# Patient Record
Sex: Female | Born: 2000
Health system: Southern US, Community
[De-identification: ages and names within clinical notes are randomized; demographics above are authoritative.]

## PROBLEM LIST (undated history)

## (undated) DIAGNOSIS — F909 Attention-deficit hyperactivity disorder, unspecified type: Secondary | ICD-10-CM

---
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
## 2021-03-04 ENCOUNTER — Ambulatory Visit: Payer: 59 | Admitting: Podiatry

## 2021-10-01 ENCOUNTER — Ambulatory Visit: Payer: 59 | Admitting: Podiatry

## 2022-01-18 ENCOUNTER — Ambulatory Visit (HOSPITAL_COMMUNITY)
Admission: EM | Admit: 2022-01-18 | Discharge: 2022-01-18 | Disposition: A | Discharge status: Home / Self Care | Payer: 59 | Attending: Internal Medicine | Admitting: Internal Medicine

## 2022-01-18 ENCOUNTER — Ambulatory Visit (INDEPENDENT_AMBULATORY_CARE_PROVIDER_SITE_OTHER): Payer: 59

## 2022-01-18 ENCOUNTER — Encounter (HOSPITAL_COMMUNITY): Payer: Self-pay | Admitting: Emergency Medicine

## 2022-01-18 DIAGNOSIS — S60221A Contusion of right hand, initial encounter: Secondary | ICD-10-CM

## 2022-01-18 HISTORY — DX: Attention-deficit hyperactivity disorder, unspecified type: F90.9
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
## 2022-01-18 NOTE — Discharge Instructions (Addendum)
Take ibuprofen as needed for pain Gentle range of motion exercises involving the right hand Your x-ray is negative for fracture Return to urgent care if symptoms worsen.
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
## 2022-02-12 ENCOUNTER — Ambulatory Visit: Payer: 59 | Admitting: Internal Medicine

## 2022-05-09 ENCOUNTER — Other Ambulatory Visit: Payer: Self-pay

## 2022-05-09 ENCOUNTER — Encounter (HOSPITAL_COMMUNITY): Payer: Self-pay | Admitting: Emergency Medicine

## 2022-05-09 ENCOUNTER — Emergency Department (HOSPITAL_COMMUNITY)
Admission: EM | Admit: 2022-05-09 | Discharge: 2022-05-09 | Disposition: A | Discharge status: Home / Self Care | Payer: 59 | Attending: Emergency Medicine | Admitting: Emergency Medicine

## 2022-05-09 ENCOUNTER — Emergency Department (HOSPITAL_COMMUNITY): Payer: 59

## 2022-05-09 DIAGNOSIS — R1084 Generalized abdominal pain: Secondary | ICD-10-CM | POA: Diagnosis not present

## 2022-05-09 DIAGNOSIS — D72829 Elevated white blood cell count, unspecified: Secondary | ICD-10-CM | POA: Insufficient documentation

## 2022-05-09 DIAGNOSIS — R1032 Left lower quadrant pain: Secondary | ICD-10-CM | POA: Diagnosis present

## 2022-05-09 DIAGNOSIS — R112 Nausea with vomiting, unspecified: Secondary | ICD-10-CM | POA: Diagnosis not present

## 2022-05-09 LAB — COMPREHENSIVE METABOLIC PANEL
ALT: 15 U/L (ref 0–44)
AST: 22 U/L (ref 15–41)
Albumin: 3.9 g/dL (ref 3.5–5.0)
Alkaline Phosphatase: 85 U/L (ref 38–126)
Anion gap: 12 (ref 5–15)
BUN: 7 mg/dL (ref 6–20)
CO2: 25 mmol/L (ref 22–32)
Calcium: 9.2 mg/dL (ref 8.9–10.3)
Chloride: 102 mmol/L (ref 98–111)
Creatinine, Ser: 0.93 mg/dL (ref 0.44–1.00)
GFR, Estimated: >60 mL/min (ref >60)
Glucose, Bld: 109 mg/dL — ABNORMAL HIGH (ref 70–99)
Potassium: 4 mmol/L (ref 3.5–5.1)
Sodium: 139 mmol/L (ref 135–145)
Total Bilirubin: 0.7 mg/dL (ref 0.3–1.2)
Total Protein: 7.1 g/dL (ref 6.5–8.1)

## 2022-05-09 LAB — URINALYSIS, ROUTINE W REFLEX MICROSCOPIC
Bilirubin Urine: NEGATIVE
Glucose, UA: NEGATIVE mg/dL
Ketones, ur: NEGATIVE mg/dL
Leukocytes,Ua: NEGATIVE
Nitrite: NEGATIVE
Protein, ur: NEGATIVE mg/dL
Specific Gravity, Urine: 1.008 (ref 1.005–1.030)
pH: 7 (ref 5.0–8.0)

## 2022-05-09 LAB — CBC
HCT: 41 % (ref 36.0–46.0)
Hemoglobin: 14.4 g/dL (ref 12.0–15.0)
MCH: 32.6 pg (ref 26.0–34.0)
MCHC: 35.1 g/dL (ref 30.0–36.0)
MCV: 92.8 fL (ref 80.0–100.0)
Platelets: 316 10*3/uL (ref 150–400)
RBC: 4.42 MIL/uL (ref 3.87–5.11)
RDW: 11.8 % (ref 11.5–15.5)
WBC: 14.3 10*3/uL — ABNORMAL HIGH (ref 4.0–10.5)
nRBC: 0 % (ref 0.0–0.2)

## 2022-05-09 LAB — I-STAT BETA HCG BLOOD, ED (MC, WL, AP ONLY): I-stat hCG, quantitative: <5 m[IU]/mL (ref <5)

## 2022-05-09 LAB — LIPASE, BLOOD: Lipase: 28 U/L (ref 11–51)

## 2022-05-09 MED ORDER — MORPHINE SULFATE (PF) 4 MG/ML IV SOLN
4.0000 mg | Freq: Once | INTRAVENOUS | Status: AC
  Administered 2022-05-09: 4 mg via INTRAVENOUS
  Filled 2022-05-09: qty 1

## 2022-05-09 MED ORDER — ONDANSETRON HCL 4 MG/2ML IJ SOLN
4.0000 mg | Freq: Once | INTRAMUSCULAR | Status: AC
  Administered 2022-05-09: 4 mg via INTRAVENOUS
  Filled 2022-05-09: qty 2

## 2022-05-09 MED ORDER — ONDANSETRON 4 MG PO TBDP
4.0000 mg | ORAL_TABLET | Freq: Once | ORAL | Status: AC
  Administered 2022-05-09: 4 mg via ORAL
  Filled 2022-05-09: qty 1

## 2022-05-09 MED ORDER — SODIUM CHLORIDE 0.9 % IV BOLUS
1000.0000 mL | Freq: Once | INTRAVENOUS | Status: AC
  Administered 2022-05-09: 1000 mL via INTRAVENOUS

## 2022-05-09 MED ORDER — ONDANSETRON 4 MG PO TBDP: 4.0000 mg | ORAL_TABLET | Freq: Three times a day (TID) | ORAL | 0 refills | Status: AC | PRN

## 2022-05-09 NOTE — ED Notes (Signed)
Patient transported to Ultrasound 

## 2022-05-09 NOTE — Discharge Instructions (Signed)
Plan diet, advance slowly as tolerated.  Recheck with your doctor if symptoms.  Seen abdominal pain the next 24 hours.  Return to the emergency room anytime for worsening or concerning symptoms.

## 2022-05-09 NOTE — ED Provider Notes (Signed)
[facility] EMERGENCY DEPARTMENT Provider Note   CSN: [id number] Arrival date & time: 05/09/22  0740     History  Chief Complaint  Patient presents with   Abdominal Pain   Emesis   Nausea    Sara Mcdaniel is a 21 y.o. female.  21 yo female with history of ADHD brought in by mom with complaint of LLQ abdominal pain that work her from her sleep at 3am. Pain radiates up to LUQ and across to RLQ. Associated with nausea and vomiting. Denies changes in bowel or bladder habits. Given zofran in triage and feels this made pain worse.  No prior abdominal surgeries. Does not drink alcohol, no drug use, non smoker. No known sick contacts.        Home Medications Prior to Admission medications   Medication Sig Start Date End Date Taking? Authorizing Provider  ibuprofen (ADVIL) 200 MG tablet Take 400 mg by mouth every 6 (six) hours as needed for cramping.   Yes [provider]  lamoTRIgine (LAMICTAL) 150 MG tablet Take 150 mg by mouth daily. 01/17/22  Yes [provider]  ondansetron (ZOFRAN-ODT) 4 MG disintegrating tablet Take 1 tablet (4 mg total) by mouth every 8 (eight) hours as needed for nausea or vomiting. 05/09/22  Yes Jeannie Fend, PA-C  OVER THE COUNTER MEDICATION Take 1 capsule by mouth daily as needed (appetite control). SLMR weight loss supplement   Yes [provider]  sertraline (ZOLOFT) 50 MG tablet Take 50 mg by mouth daily. 01/17/22  Yes [provider]  TRI-ESTARYLLA 0.18/0.215/0.25 MG-35 MCG tablet Take 1 tablet by mouth daily. 01/15/22  Yes [provider]  VYVANSE 40 MG capsule Take 40 mg by mouth daily. 12/23/21  Yes [provider]      Allergies    Amoxil [amoxicillin] and Penicillins    Review of Systems   Review of Systems Negative except as per HPI Physical Exam Updated Vital Signs BP 130/89   Pulse (!) 56   Temp 98 F (36.7 C)   Resp 15   Ht 5\' 4"  (1.626 m)   Wt 57.2 kg   LMP  04/22/2022   SpO2 99%   BMI 21.63 kg/m  Physical Exam Vitals and nursing note reviewed.  Constitutional:      General: She is not in acute distress.    Appearance: She is well-developed. She is not diaphoretic.  HENT:     Head: Normocephalic and atraumatic.  Cardiovascular:     Rate and Rhythm: Normal rate and regular rhythm.     Heart sounds: Normal heart sounds.  Pulmonary:     Effort: Pulmonary effort is normal.     Breath sounds: Normal breath sounds.  Abdominal:     Palpations: Abdomen is soft.     Tenderness: There is generalized abdominal tenderness. There is no right CVA tenderness, left CVA tenderness, guarding or rebound.  Skin:    General: Skin is warm and dry.  Neurological:     Mental Status: She is alert and oriented to person, place, and time.  Psychiatric:        Mood and Affect: Mood is anxious.        Behavior: Behavior normal.     ED Results / Procedures / Treatments   Labs (all labs ordered are listed, but only abnormal results are displayed) Labs Reviewed  COMPREHENSIVE METABOLIC PANEL - Abnormal; Notable for the following components:      Result Value  Glucose, Bld 109 (*)    All other components within normal limits  CBC - Abnormal; Notable for the following components:   WBC 14.3 (*)    All other components within normal limits  URINALYSIS, ROUTINE W REFLEX MICROSCOPIC - Abnormal; Notable for the following components:   Color, Urine STRAW (*)    Hgb urine dipstick MODERATE (*)    Bacteria, UA RARE (*)    All other components within normal limits  LIPASE, BLOOD  I-STAT BETA HCG BLOOD, ED ([facility], [facility], [facility] ONLY)    EKG None  Radiology US PELVIC COMPLETE W TRANSVAGINAL AND TORSION R/O  Result Date: 05/09/2022 CLINICAL DATA:  Left lower quadrant pain since 3 a.m. EXAM: TRANSABDOMINAL AND TRANSVAGINAL ULTRASOUND OF PELVIS DOPPLER ULTRASOUND OF OVARIES TECHNIQUE: Both transabdominal and transvaginal ultrasound examinations of the pelvis were  performed. Transabdominal technique was performed for global imaging of the pelvis including uterus, ovaries, adnexal regions, and pelvic cul-de-sac. It was necessary to proceed with endovaginal exam following the transabdominal exam to visualize the adnexa. Color and duplex Doppler ultrasound was utilized to evaluate blood flow to the ovaries. COMPARISON:  None Available. FINDINGS: Uterus Measurements: 6 x 3 x 4 cm = volume: 45 mL. No fibroids or other mass visualized. Endometrium Thickness: 5 mm.  No focal abnormality visualized. Right ovary Measurements: 30 x 14 x 24 mm = volume: 5.4 mL. Normal appearance/no adnexal mass. Left ovary Measurements: 23 x 23 x 14 mm = volume: 3.8 mL. Normal appearance/no adnexal mass. Pulsed Doppler evaluation of both ovaries demonstrates normal low-resistance arterial and venous waveforms. Other findings No abnormal free fluid. IMPRESSION: Normal pelvic ultrasound. Electronically Signed   By: Tiburcio Pea M.D.   On: 05/09/2022 11:50    Procedures Procedures    Medications Ordered in ED Medications  ondansetron (ZOFRAN-ODT) disintegrating tablet 4 mg (4 mg Oral Given 05/09/22 0752)  sodium chloride 0.9 % bolus 1,000 mL (0 mLs Intravenous Stopped 05/09/22 1108)  ondansetron (ZOFRAN) injection 4 mg (4 mg Intravenous Given 05/09/22 0901)  morphine (PF) 4 MG/ML injection 4 mg (4 mg Intravenous Given 05/09/22 0901)    ED Course/ Medical Decision Making/ A&P                           Medical Decision Making Amount and/or Complexity of Data Reviewed Labs: ordered. Radiology: ordered.  Risk Prescription drug management.   This patient presents to the ED for concern of abdominal pain, this involves an extensive number of treatment options, and is a complaint that carries with it a high risk of complications and morbidity.  The differential diagnosis includes but not limited to gastritis, ovarian cyst, ovarian torsion, colitis, diverticulitis, SBO, appendicitis,  pancreatitis, UTI, pyelonephritis    Co morbidities that complicate the patient evaluation  ADHD   Additional history obtained:  Additional history obtained from mom at bedside External records from outside source obtained and reviewed including prior labs on file from 12/2019   Lab Tests:  I Ordered, and personally interpreted labs.  The pertinent results include:  CBC with mild leukocytosis- WBC 14k, lipase WNL, CMP without significant findings, UA with moderate hgb, no significant blood, no evidence of UTI, hcg negative   Imaging Studies ordered:  I ordered imaging studies including pelvic US  I independently visualized and interpreted imaging which showed no acute abnormality, specifically, negative for torsion or cyst/mass I agree with the radiologist interpretation  Problem List / ED Course / Critical  interventions / Medication management  21 year old female brought in by mom with complaint of left lower quadrant abdominal pain which woke her from her sleep at 3 AM today radiating across to the right lower quadrant as well as up her left side, associated with nausea and vomiting.  On exam, has generalized abdominal discomfort.  Labs were assessed, has nonspecific leukocytosis, otherwise labs are reassuring. Repeat abdominal exam, has lower abdominal tenderness, will order pelvic US.  Pelvic ultrasound is reassuring.  On repeat exam, abdomen is soft nontender, patient is feeling much improved and tolerating p.o.'s.  Plan is to discharge with prescription for Zofran should nausea and vomiting return.  Recommend recheck with PCP if abdominal pain returns or persist through the next 24 hours with return to ER precautions for worsening or concerning symptoms. I ordered medication including morphine, Zofran, IV fluids for pain and nausea Reevaluation of the patient after these medicines showed that the patient resolved I have reviewed the patients home medicines and have made  adjustments as needed   Social Determinants of Health:  Lives with mom, has PCP   Test / Admission - Considered:  Considered CT a/p with contrast for appendicitis however history and PE not consistent with appy at this time. Reconsider if pain localizes to RLQ or for worsening or concerning symptoms.          Final Clinical Impression(s) / ED Diagnoses Final diagnoses:  Generalized abdominal pain  Nausea and vomiting, unspecified vomiting type    Rx / DC Orders ED Discharge Orders          Ordered    ondansetron (ZOFRAN-ODT) 4 MG disintegrating tablet  Every 8 hours PRN        05/09/22 1220              Jeannie Fend, PA-C 05/09/22 1244    Jacalyn Lefevre, MD 05/12/22 1605

## 2022-05-09 NOTE — ED Triage Notes (Signed)
Pt. Stated, I woke up around 3 with left side pain and then started throwing up.

## 2023-02-01 IMAGING — DX DG HAND COMPLETE 3+V*R*
3 series · 3 of 3 positions shown · non-contrast
Comparison: None.

CLINICAL DATA: Right hand pain

EXAM:
RIGHT HAND - COMPLETE 3+ VIEW

[hand pa]
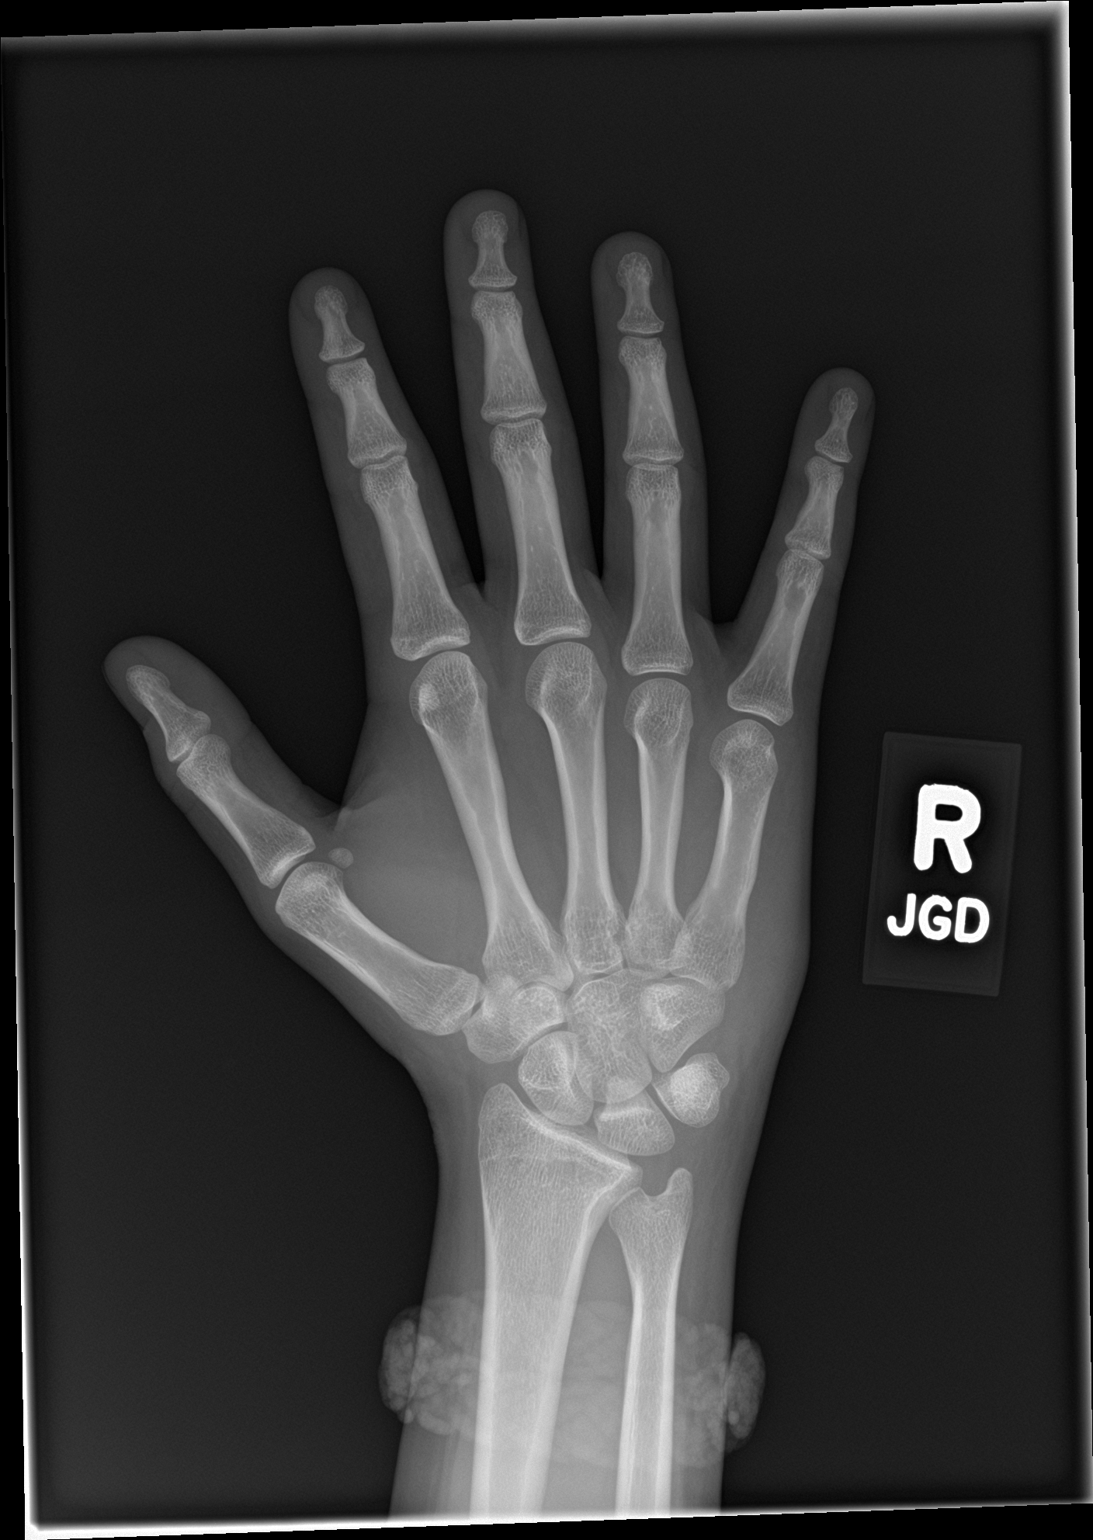

[hand obl]
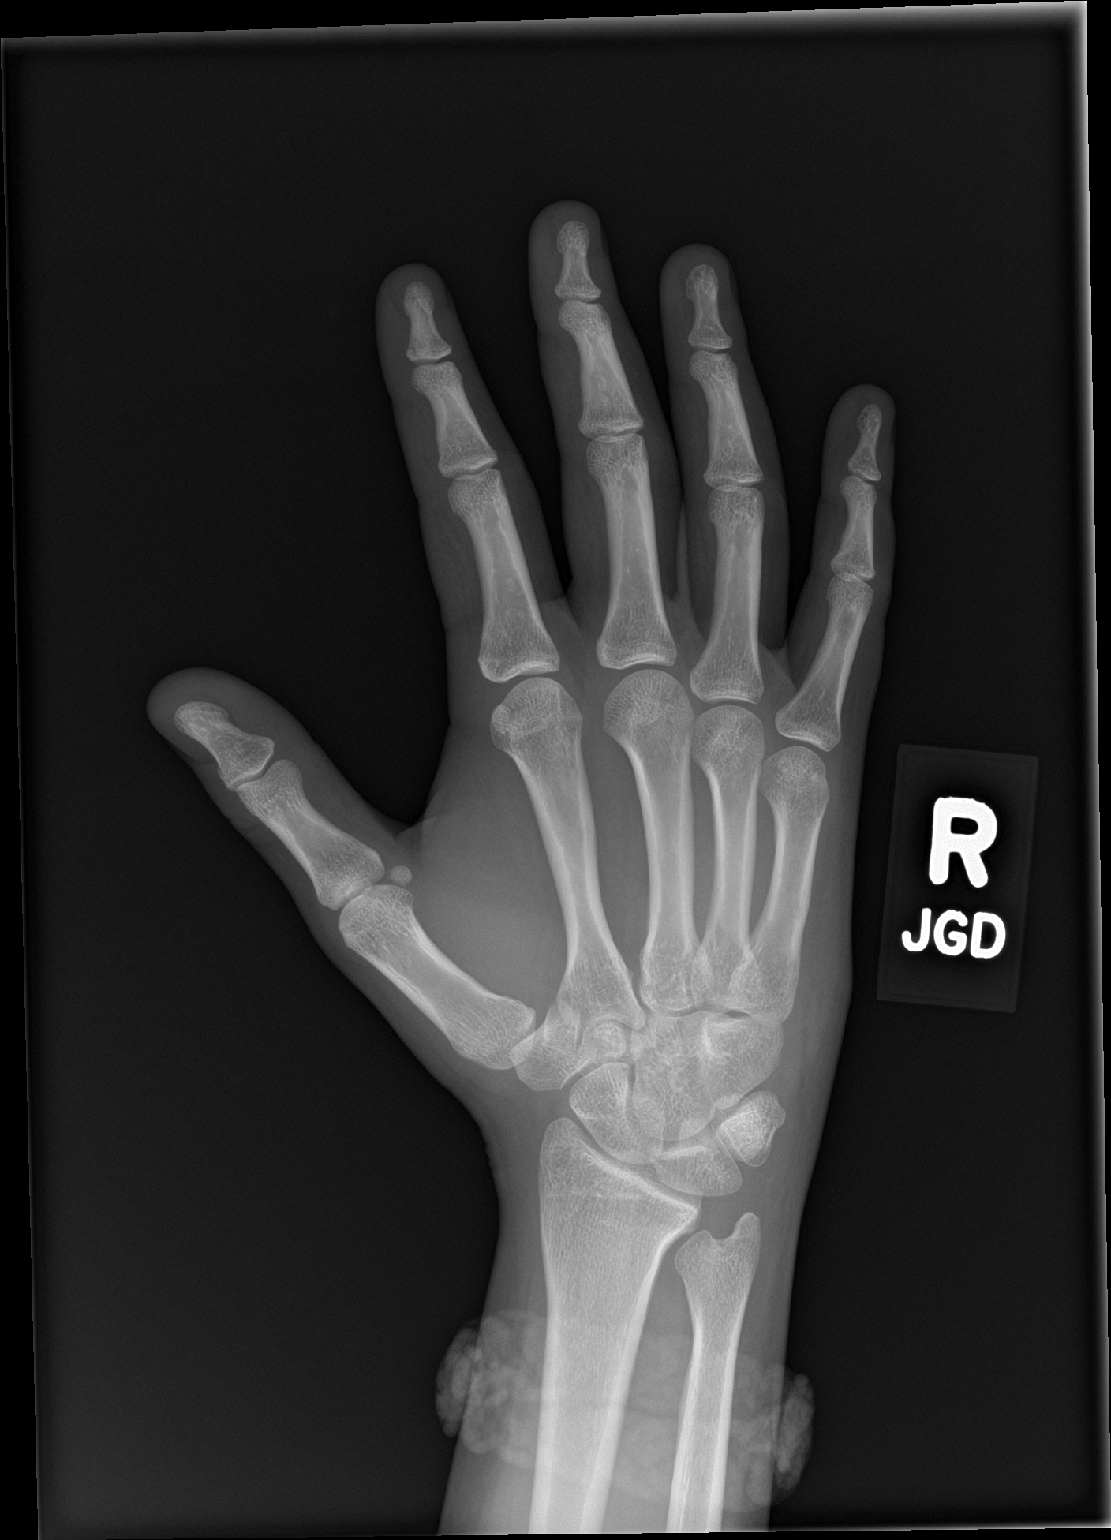

[hand lat]
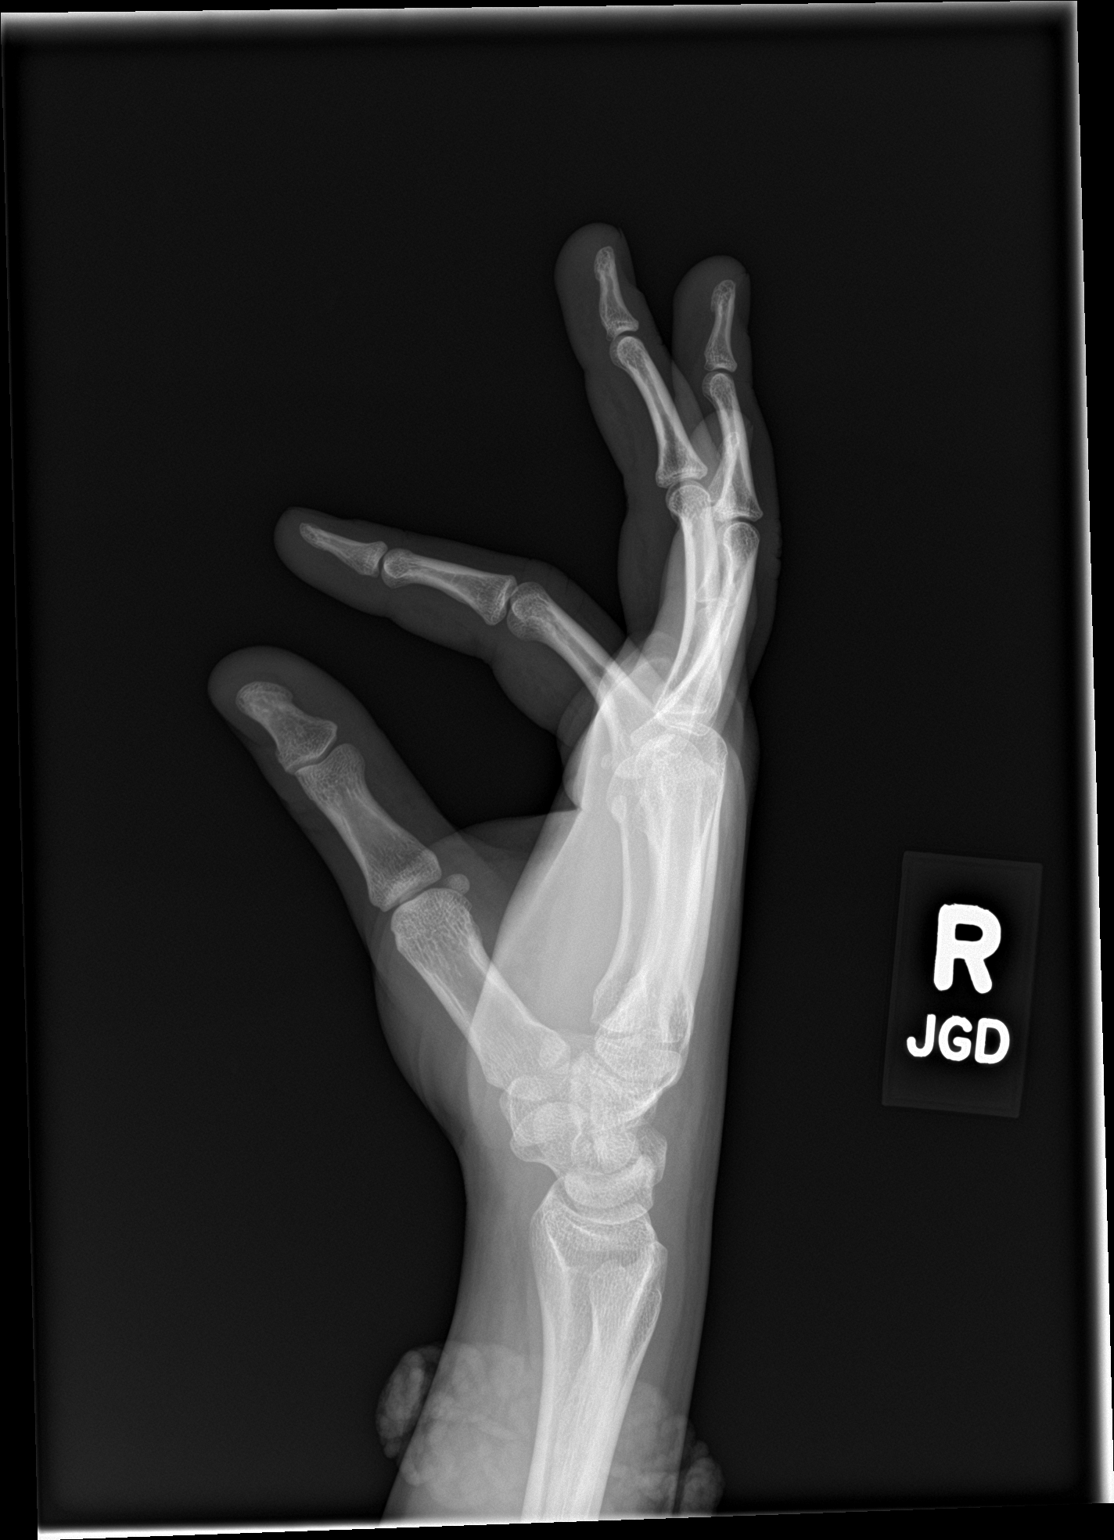

[3 of 3 positions shown; findings below may reference images not displayed]

FINDINGS: There is no evidence of fracture or dislocation. There is no
evidence of arthropathy or other focal bone abnormality. Soft
tissues are unremarkable.
IMPRESSION: No acute osseous abnormality identified.

## 2023-05-23 IMAGING — US US PELVIS COMPLETE TRANSABD/TRANSVAG W DUPLEX AND/OR DOPPLER
1 series · 13 of 25 positions shown · non-contrast
Comparison: None Available.

CLINICAL DATA: Left lower quadrant pain since 3 a.m.

EXAM:
TRANSABDOMINAL AND TRANSVAGINAL ULTRASOUND OF PELVIS
DOPPLER ULTRASOUND OF OVARIES
TECHNIQUE: Both transabdominal and transvaginal ultrasound examinations of the
pelvis were performed. Transabdominal technique was performed for
global imaging of the pelvis including uterus, ovaries, adnexal
regions, and pelvic cul-de-sac.
It was necessary to proceed with endovaginal exam following the
transabdominal exam to visualize the adnexa. Color and duplex
Doppler ultrasound was utilized to evaluate blood flow to the
ovaries.

[Series 1: us pelvis (transabdominal only) · 123 acquisitions, 13 frames shown]
[im 1/123]
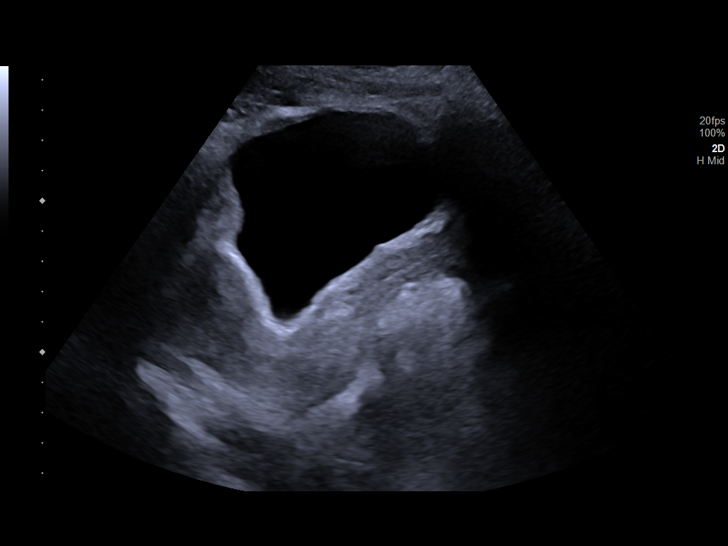
[im 11/123]
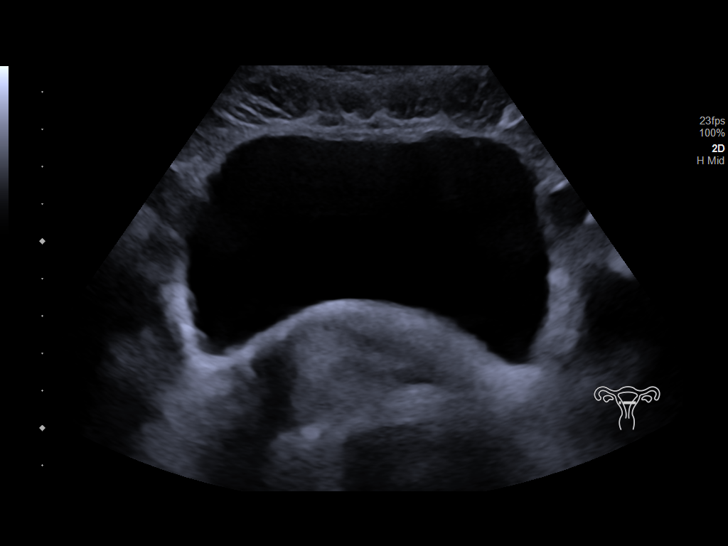
[im 21/123]
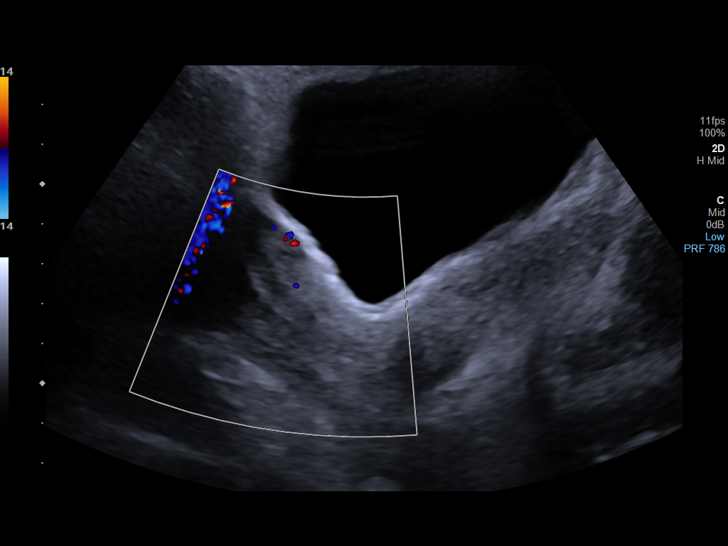
[im 31/123]
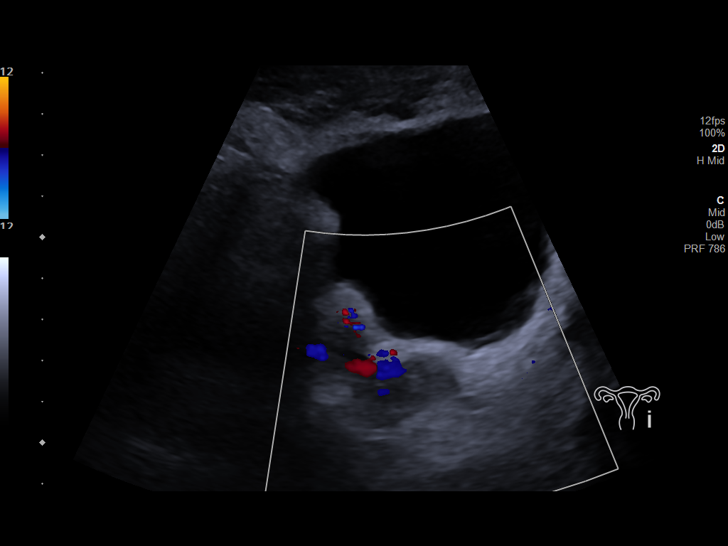
[im 41/123]
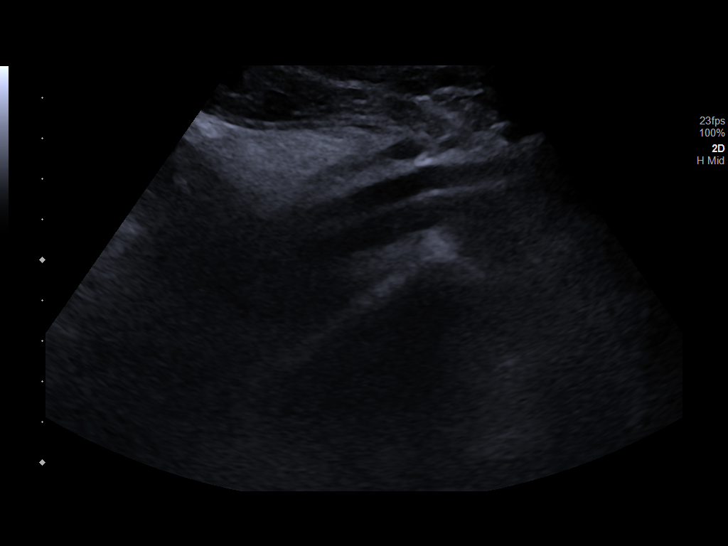
[im 51/123]
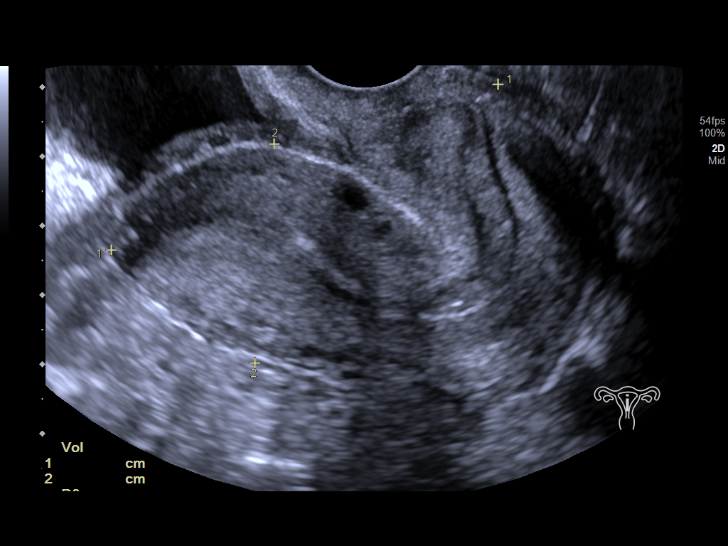
[im 62/123]
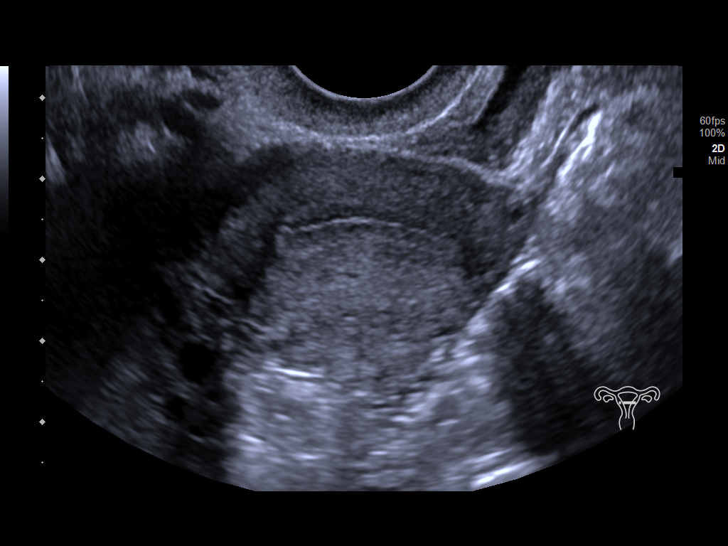
[im 72/123]
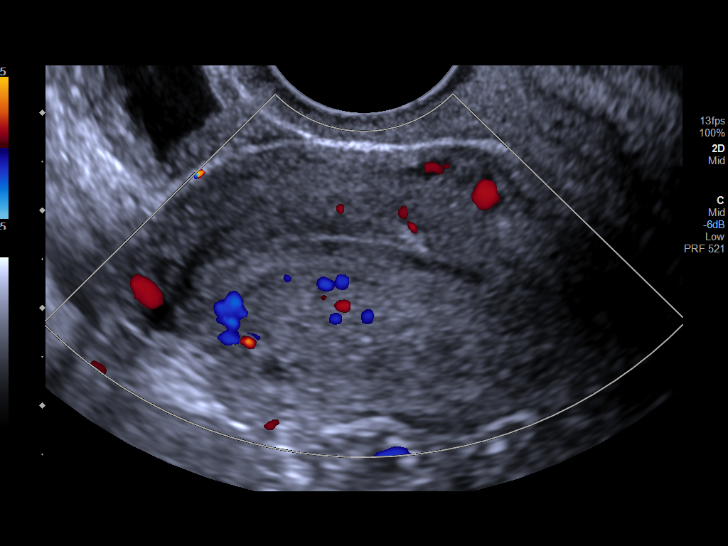
[im 82/123]
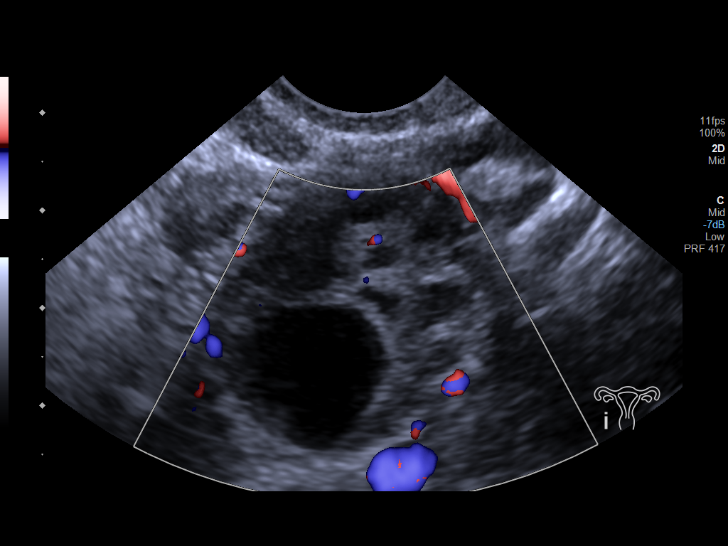
[im 92/123]
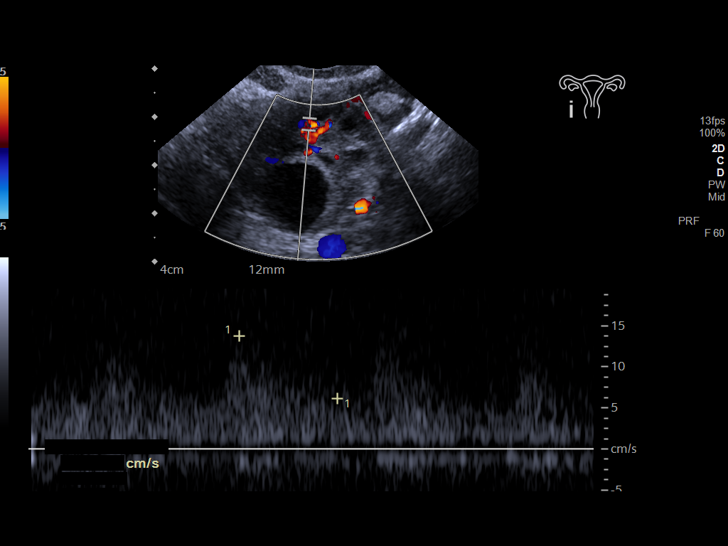
[im 102/123]
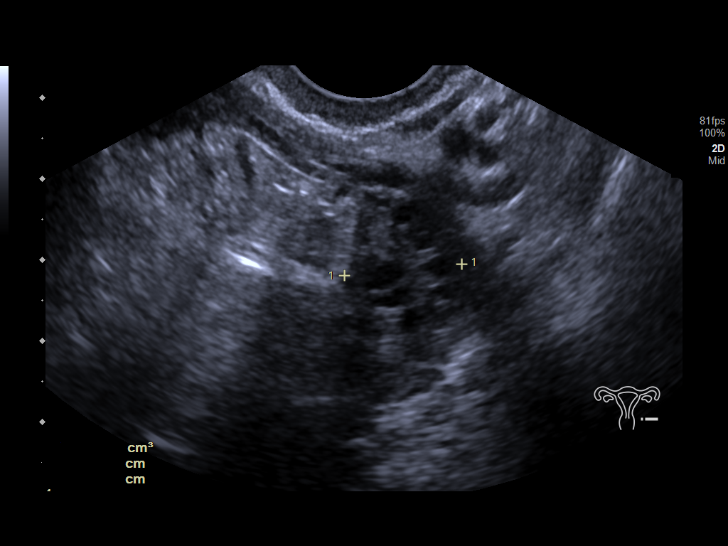
[im 112/123]
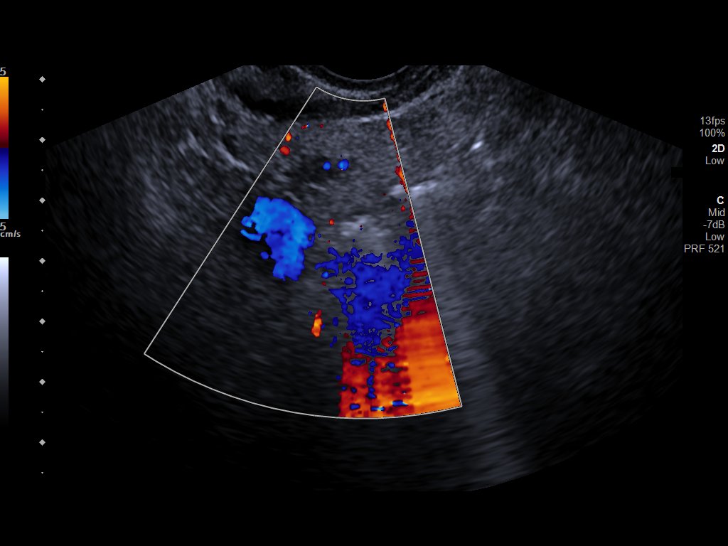
[im 123/123]
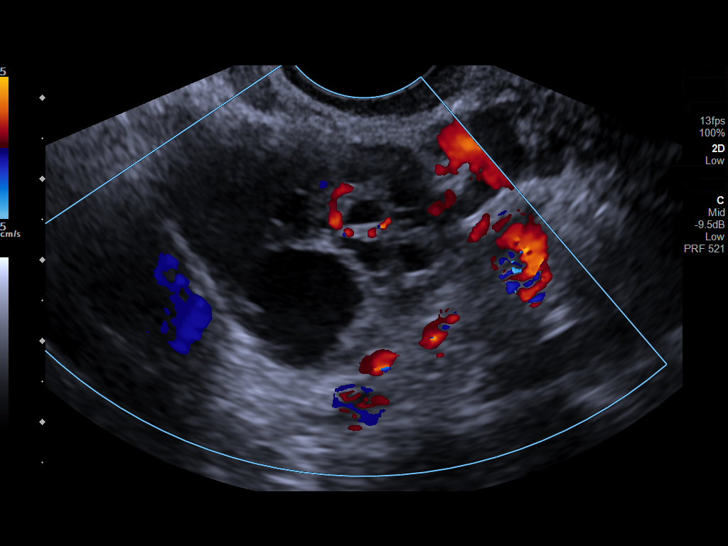

[13 of 25 positions shown; findings below may reference images not displayed]

FINDINGS: Uterus

Measurements: 6 x 3 x 4 cm = volume: 45 mL. No fibroids or other
mass visualized.

Endometrium

Thickness: 5 mm.  No focal abnormality visualized.

Right ovary

Measurements: 30 x 14 x 24 mm = volume: 5.4 mL. Normal appearance/no
adnexal mass.

Left ovary

Measurements: 23 x 23 x 14 mm = volume: 3.8 mL. Normal appearance/no
adnexal mass.

Pulsed Doppler evaluation of both ovaries demonstrates normal
low-resistance arterial and venous waveforms.

Other findings

No abnormal free fluid.
IMPRESSION: Normal pelvic ultrasound.
